# Patient Record
Sex: Female | Born: 1985 | Race: White | Hispanic: No | Smoking: Former smoker
Health system: Southern US, Community
[De-identification: ages and names within clinical notes are randomized; demographics above are authoritative.]

## PROBLEM LIST (undated history)

## (undated) DIAGNOSIS — L508 Other urticaria: Secondary | ICD-10-CM

---
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
## 2017-06-25 ENCOUNTER — Other Ambulatory Visit: Payer: Self-pay | Admitting: Obstetrics and Gynecology

## 2017-06-25 DIAGNOSIS — R928 Other abnormal and inconclusive findings on diagnostic imaging of breast: Secondary | ICD-10-CM

## 2017-07-04 ENCOUNTER — Ambulatory Visit: Payer: Self-pay

## 2017-07-04 ENCOUNTER — Ambulatory Visit
Admission: RE | Admit: 2017-07-04 | Discharge: 2017-07-04 | Disposition: A | Discharge status: Home / Self Care | Payer: BLUE CROSS/BLUE SHIELD | Source: Ambulatory Visit | Attending: Obstetrics and Gynecology | Admitting: Obstetrics and Gynecology

## 2017-07-04 ENCOUNTER — Other Ambulatory Visit: Payer: Self-pay | Admitting: Obstetrics and Gynecology

## 2017-07-04 DIAGNOSIS — R928 Other abnormal and inconclusive findings on diagnostic imaging of breast: Secondary | ICD-10-CM

## 2017-07-04 DIAGNOSIS — N6489 Other specified disorders of breast: Secondary | ICD-10-CM

## 2018-01-12 ENCOUNTER — Ambulatory Visit
Admission: RE | Admit: 2018-01-12 | Discharge: 2018-01-12 | Disposition: A | Discharge status: Home / Self Care | Payer: BLUE CROSS/BLUE SHIELD | Source: Ambulatory Visit | Attending: Obstetrics and Gynecology | Admitting: Obstetrics and Gynecology

## 2018-01-12 ENCOUNTER — Other Ambulatory Visit: Payer: Self-pay | Admitting: Obstetrics and Gynecology

## 2018-01-12 DIAGNOSIS — N6489 Other specified disorders of breast: Secondary | ICD-10-CM

## 2018-01-12 DIAGNOSIS — N6320 Unspecified lump in the left breast, unspecified quadrant: Secondary | ICD-10-CM

## 2018-01-16 ENCOUNTER — Ambulatory Visit
Admission: RE | Admit: 2018-01-16 | Discharge: 2018-01-16 | Disposition: A | Discharge status: Home / Self Care | Payer: BLUE CROSS/BLUE SHIELD | Source: Ambulatory Visit | Attending: Obstetrics and Gynecology | Admitting: Obstetrics and Gynecology

## 2018-01-16 ENCOUNTER — Other Ambulatory Visit: Payer: Self-pay | Admitting: Obstetrics and Gynecology

## 2018-01-16 DIAGNOSIS — N6320 Unspecified lump in the left breast, unspecified quadrant: Secondary | ICD-10-CM

## 2018-10-23 ENCOUNTER — Other Ambulatory Visit: Payer: Self-pay | Admitting: Obstetrics and Gynecology

## 2018-10-23 DIAGNOSIS — N6322 Unspecified lump in the left breast, upper inner quadrant: Secondary | ICD-10-CM

## 2018-11-04 ENCOUNTER — Ambulatory Visit
Admission: RE | Admit: 2018-11-04 | Discharge: 2018-11-04 | Disposition: A | Discharge status: Home / Self Care | Payer: BC Managed Care – PPO | Source: Ambulatory Visit | Attending: Obstetrics and Gynecology | Admitting: Obstetrics and Gynecology

## 2018-11-04 ENCOUNTER — Other Ambulatory Visit: Payer: Self-pay

## 2018-11-04 DIAGNOSIS — N6322 Unspecified lump in the left breast, upper inner quadrant: Secondary | ICD-10-CM

## 2019-01-18 ENCOUNTER — Emergency Department (HOSPITAL_COMMUNITY)
Admission: EM | Admit: 2019-01-18 | Discharge: 2019-01-18 | Disposition: A | Discharge status: Home / Self Care | Payer: BC Managed Care – PPO | Attending: Emergency Medicine | Admitting: Emergency Medicine

## 2019-01-18 DIAGNOSIS — T7840XA Allergy, unspecified, initial encounter: Secondary | ICD-10-CM | POA: Insufficient documentation

## 2019-01-18 DIAGNOSIS — R220 Localized swelling, mass and lump, head: Secondary | ICD-10-CM | POA: Insufficient documentation

## 2019-01-18 DIAGNOSIS — L509 Urticaria, unspecified: Secondary | ICD-10-CM | POA: Diagnosis not present

## 2019-01-18 LAB — URINALYSIS, ROUTINE W REFLEX MICROSCOPIC
Bilirubin Urine: NEGATIVE
Glucose, UA: NEGATIVE mg/dL
Ketones, ur: NEGATIVE mg/dL
Nitrite: NEGATIVE
Protein, ur: 30 mg/dL — AB
Specific Gravity, Urine: 1.031 — ABNORMAL HIGH (ref 1.005–1.030)
pH: 5 (ref 5.0–8.0)

## 2019-01-18 MED ORDER — PREDNISONE 20 MG PO TABS
50.0000 mg | ORAL_TABLET | Freq: Once | ORAL | Status: AC
  Administered 2019-01-18: 50 mg via ORAL
  Filled 2019-01-18: qty 3

## 2019-01-18 MED ORDER — FAMOTIDINE 20 MG PO TABS: 20.0000 mg | ORAL_TABLET | Freq: Two times a day (BID) | ORAL | 0 refills | Status: AC

## 2019-01-18 MED ORDER — PREDNISONE 50 MG PO TABS: 50.0000 mg | ORAL_TABLET | Freq: Every day | ORAL | 0 refills | Status: DC

## 2019-01-18 MED ORDER — DIPHENHYDRAMINE HCL 25 MG PO CAPS
25.0000 mg | ORAL_CAPSULE | Freq: Once | ORAL | Status: AC
  Administered 2019-01-18: 25 mg via ORAL
  Filled 2019-01-18: qty 1

## 2019-01-18 MED ORDER — FAMOTIDINE 20 MG PO TABS
20.0000 mg | ORAL_TABLET | Freq: Once | ORAL | Status: AC
  Administered 2019-01-18: 20 mg via ORAL
  Filled 2019-01-18: qty 1

## 2019-01-18 NOTE — ED Notes (Signed)
Patient verbalizes understanding of discharge instructions. Opportunity for questioning and answers were provided. Armband removed by staff, pt discharged from ED ambulatory to Home.   

## 2019-01-18 NOTE — Discharge Instructions (Signed)
Please read attached information. If you experience any new or worsening signs or symptoms please return to the emergency room for evaluation. Please follow-up with your primary care provider or specialist as discussed. Please use medication prescribed only as directed and discontinue taking if you have any concerning signs or symptoms.   °

## 2019-01-18 NOTE — ED Provider Notes (Signed)
[facility] EMERGENCY DEPARTMENT Provider Note   CSN: [id number] Arrival date & time: 01/18/19  0725     History   Chief Complaint Chief Complaint  Patient presents with  . Allergic Reaction    HPI Tammy Steele is a 33 y.o. female.     HPI   33 year old female presents today with complaints of hives.  Patient notes that 3 days ago while at work she started to develop hives and swelling to her face.  She notes that she followed up in the emergency room the following day as symptoms were not improving with Benadryl.  She notes she was given epinephrine, Benadryl and steroids.  She notes her symptoms did improve and she went home.  She notes they came back approximate 4 hours later.  She was again seen the following day and was having ongoing hives.  She was also having increased anxiety and stress.  She has not been taking the prednisone as it was thought to increase her stress.  She denies any abnormal exposures including new foods drinks body care products.  She does note she has increased stress recently.  She notes she has had hives previously but not this severe.  She denies any recent medications, other than Macrobid approximately 2 weeks prior.  She does note some pain in burning with urination no vaginal discharge, she does note vaginal bleeding with her menstrual cycle presently.  She denies any abdominal pain nausea vomiting or diarrhea.  She denies any swelling to the oropharynx or any shortness of breath.  She took Benadryl at approximately 4:30 AM today.  No past medical history on file.  There are no active problems to display for this patient.   No past surgical history on file.   OB History   No obstetric history on file.      Home Medications    Prior to Admission medications   Medication Sig Start Date End Date Taking? Authorizing Provider  famotidine (PEPCID) 20 MG tablet Take 1 tablet (20 mg total) by mouth 2 (two) times daily.  01/18/19   Benjamin Merrihew, Dellis Filbert, PA-C  predniSONE (DELTASONE) 50 MG tablet Take 1 tablet (50 mg total) by mouth daily. 01/18/19   Okey Regal, PA-C    Family History Family History  Problem Relation Age of Onset  . Breast cancer Mother 35    Social History Social History   Tobacco Use  . Smoking status: Not on file  Substance Use Topics  . Alcohol use: Not on file  . Drug use: Not on file     Allergies   Patient has no allergy information on record.   Review of Systems Review of Systems  All other systems reviewed and are negative.   Physical Exam Updated Vital Signs BP 105/70 (BP Location: Right Arm)   Pulse 89   Temp 98.5 F (36.9 C) (Oral)   Resp 12   SpO2 100%   Physical Exam Vitals signs and nursing note reviewed.  Constitutional:      Appearance: She is well-developed.  HENT:     Head: Normocephalic and atraumatic.     Comments: Oropharynx is clear no erythema edema or exudate, voice is normal, handling secretions without difficulty Eyes:     General: No scleral icterus.       Right eye: No discharge.        Left eye: No discharge.     Conjunctiva/sclera: Conjunctivae normal.     Pupils: Pupils are equal, round,  and reactive to light.  Neck:     Musculoskeletal: Normal range of motion.     Vascular: No JVD.     Trachea: No tracheal deviation.  Pulmonary:     Effort: Pulmonary effort is normal. No respiratory distress.     Breath sounds: Normal breath sounds. No stridor. No wheezing, rhonchi or rales.  Skin:    Comments: Hives noted throughout the entire body  Neurological:     Mental Status: She is alert and oriented to person, place, and time.     Coordination: Coordination normal.  Psychiatric:        Behavior: Behavior normal.        Thought Content: Thought content normal.        Judgment: Judgment normal.     ED Treatments / Results  Labs (all labs ordered are listed, but only abnormal results are displayed) Labs Reviewed   URINALYSIS, ROUTINE W REFLEX MICROSCOPIC - Abnormal; Notable for the following components:      Result Value   Color, Urine AMBER (*)    APPearance HAZY (*)    Specific Gravity, Urine 1.031 (*)    Hgb urine dipstick MODERATE (*)    Protein, ur 30 (*)    Leukocytes,Ua SMALL (*)    Bacteria, UA RARE (*)    All other components within normal limits  URINE CULTURE  POC URINE PREG, ED    EKG None  Radiology No results found.  Procedures Procedures (including critical care time)  Medications Ordered in ED Medications  predniSONE (DELTASONE) tablet 50 mg (50 mg Oral Given 01/18/19 0953)  famotidine (PEPCID) tablet 20 mg (20 mg Oral Given 01/18/19 0953)  diphenhydrAMINE (BENADRYL) capsule 25 mg (25 mg Oral Given 01/18/19 0953)     Initial Impression / Assessment and Plan / ED Course  I have reviewed the triage vital signs and the nursing notes.  Pertinent labs & imaging results that were available during my care of the patient were reviewed by me and considered in my medical decision making (see chart for details).        33 year old female presents today with hives.  Uncertain etiology at this time.  She has no signs of anaphylaxis, no other system involvement.  Patient is having extreme itching, I do think it is reasonable for her to continue using her prednisone, Benadryl, and Pepcid as needed.  She has no new medications or exposures at this time to be discontinued or avoided.  Patient will be given a dose of the medication here, no indication for hospitalization or ongoing management at this time.  I would encourage her to follow-up with outpatient allergy testing, she will return immediately if she develops any new or worsening signs or symptoms and follow-up with her primary care provider in the next 2 to 3 days.  She verbalized understanding and agreement to today's plan had no further questions or concerns at the time of discharge.   Final Clinical Impressions(s) / ED  Diagnoses   Final diagnoses:  Hives    ED Discharge Orders         Ordered    predniSONE (DELTASONE) 50 MG tablet  Daily     01/18/19 1109    famotidine (PEPCID) 20 MG tablet  2 times daily     01/18/19 1109           Eyvonne Mechanic, PA-C 01/18/19 1159    Margarita Grizzle, MD 01/18/19 1819

## 2019-01-18 NOTE — ED Triage Notes (Signed)
Pt reports allergic reaction beginning Friday. Went to the ER in [place] twice over the weekend and received epi x2. Pt here no acute distress. Discharged with scripts for prednisone and benedryl. Was told to stop taking prednisone. Still taking benedryl. Lungs CTA. Has hive rash allover.

## 2019-01-19 LAB — URINE CULTURE
Culture: <10000 — AB
Special Requests: NORMAL

## 2019-01-21 NOTE — Progress Notes (Signed)
New Patient Note  RE: Tammy Steele MRN: [id number] DOB: 1985/11/10 Date of Office Visit: 01/22/2019  Referring provider: No ref. provider found Primary care provider: System, Pcp Not In  Chief Complaint: Urticaria  History of Present Illness: I had the pleasure of seeing Tammy Steele for initial evaluation at the [facility] on 01/22/2019. She is a 33 y.o. female, who is referred here by the ER for the evaluation of hives.   Hives: Rash started about 1 week ago. She started to break out in her right ear and developed lumps on her scalp. Then it spread to her hands, feet, chest and face.  Patient went to the ER on 10/10. She was given epi, steroids, benadryl there and symptoms improved within a few hours but then she developed new hives after 4-5 hours at home.   She went to the ER on 10/11 and was given the same above regimen due to worsening hives which helped.  She also went to the ER on 01/20/2019 as she had some shortness of breath.  Describes the rash as red, raised, pruritic. Reviewed images on the phone which were consistent with urticaria. Individual rashes lasts about less than 1 day. No ecchymosis upon resolution. Associated symptoms include: lump in the throat with difficulty swallowing, SOB. Suspected triggers are unknown but heat makes it worse. Denies any fevers, chills, changes in medications, foods, personal care products. Patient had UTI a few weeks ago and was treated with nitrofurantoin for 5 days. Last dose was 4 days prior to hive onset.  Patient had this antibiotic before with no issues.   Patient is also under a lot of stress. Her husband left patient 1 year ago and had increased stress lately due to this.   She has tried the following therapies: Pepcid  BID, Claritin  QD, prednisone 10 mg daily with some benefit. Systemic steroids yes. Previous work up includes: none. Previous history of rash/hives: none.  Hives have  significantly improved and yesterday she had a few blotches.  No hives today.  01/18/2019 ER HPI: "33 year old female presents today with complaints of hives.  Patient notes that 3 days ago while at work she started to develop hives and swelling to her face.  She notes that she followed up in the emergency room the following day as symptoms were not improving with Benadryl.  She notes she was given epinephrine, Benadryl and steroids.  She notes her symptoms did improve and she went home.  She notes they came back approximate 4 hours later.  She was again seen the following day and was having ongoing hives.  She was also having increased anxiety and stress.  She has not been taking the prednisone as it was thought to increase her stress.  She denies any abnormal exposures including new foods drinks body care products.  She does note she has increased stress recently.  She notes she has had hives previously but not this severe.  She denies any recent medications, other than Macrobid approximately 2 weeks prior.  She does note some pain in burning with urination no vaginal discharge, she does note vaginal bleeding with her menstrual cycle presently.  She denies any abdominal pain nausea vomiting or diarrhea.  She denies any swelling to the oropharynx or any shortness of breath.  She took Benadryl at approximately 4:30 AM today."  Assessment and Plan: Tammy Steele is a 33 y.o. female with: Acute urticaria Whole body hives with angioedema starting 1 week  ago.  Required 3 ER visits and 2 IM epi with Benadryl and steroids with good benefit.  Symptoms have gradually improved.  Patient did have a UTI few weeks ago and was treated with nitrofurantoin for 5 days.  Last dose was 4 days prior to hive onset.  She has taken this antibiotic previously with no issues.  She also had increased test the last year.  Based on clinical history concern that the infection with the nitrofurantoin may have triggered these episodes of  hives and angioedema.  Advised patient to avoid nitrofurantoin.  Continue taking Pepcid 20mg  twice a day.  Continue Claritin 10mg  in the morning.  Start Zyrtec 10mg  in the evening.   Start prednisone taper tomorrow.   If you notice worsening symptoms while tapering off then let us.   You may take benadryl 25-50mg  every 4-6 hours as needed for breakthrough hives.  Avoid the following potential triggers: alcohol, tight clothing, NSAIDs.   Emergency action plan given.  For mild symptoms you can take over the counter antihistamines such as Benadryl and monitor symptoms closely. If symptoms worsen or if you have severe symptoms including breathing issues, throat closure, significant swelling, whole body hives, severe diarrhea and vomiting, lightheadedness then inject epinephrine and seek immediate medical care afterwards.  If symptoms persistent in 4 weeks then will get some blood work/work-up at that time.  If hives resolved by then will discuss tapering off Zyrtec, Claritin and Pepcid.  Multiple drug allergies History of breaking out in rash with penicillin and Bactrim.  Unsure of exact events.  Continue avoidance and consider penicillin skin testing in future.  Return in about 4 weeks (around 02/19/2019).  Other allergy screening: Asthma: no Rhino conjunctivitis: no Food allergy: no Medication allergy: yes  Penicillin - rash, itchy.  Unsure of exact events and this happened many years ago. Bactrim - rash, itchy Hymenoptera allergy: no Eczema:no History of recurrent infections suggestive of immunodeficency: no  Diagnostics: None.  Past Medical History: Patient Active Problem List   Diagnosis Date Noted  . Acute urticaria 01/22/2019  . Multiple drug allergies 01/22/2019   Past Medical History:  Diagnosis Date  . Acute urticaria 01/22/2019   Past Surgical History: Past Surgical History:  Procedure Laterality Date  . CESAREAN SECTION     Medication List:  Current  Outpatient Medications  Medication Sig Dispense Refill  . ALPRAZolam (XANAX) 0.5 MG tablet Take 0.5 mg by mouth at bedtime as needed for anxiety.    . cetirizine (ZYRTEC) 10 MG tablet Take 10 mg by mouth at bedtime.    . citalopram (CELEXA) 40 MG tablet Take 40 mg by mouth daily.    . diphenhydrAMINE (BENADRYL) 25 MG tablet Take 25 mg by mouth every 6 (six) hours as needed.    Marland Kitchen. EPINEPHrine 0.3 mg/0.3 mL IJ SOAJ injection Inject 0.3 mg into the muscle once.    . famotidine (PEPCID) 20 MG tablet Take 1 tablet (20 mg total) by mouth 2 (two) times daily. 30 tablet 0  . loratadine (CLARITIN) 10 MG tablet Take 10 mg by mouth daily.    Marland Kitchen. MONO-LINYAH 0.25-35 MG-MCG tablet Take 1 tablet by mouth daily.     No current facility-administered medications for this visit.    Allergies: Allergies  Allergen Reactions  . Bactrim [Sulfamethoxazole-Trimethoprim]   . Nitrofurantoin     hives  . Penicillins    Social History: Social History   Socioeconomic History  . Marital status: Legally Separated    Spouse name:  Not on file  . Number of children: Not on file  . Years of education: Not on file  . Highest education level: Not on file  Occupational History  . Not on file  Social Needs  . Financial resource strain: Not on file  . Food insecurity    Worry: Not on file    Inability: Not on file  . Transportation needs    Medical: Not on file    Non-medical: Not on file  Tobacco Use  . Smoking status: Former Smoker    Types: Cigarettes  . Smokeless tobacco: Never Used  Substance and Sexual Activity  . Alcohol use: Yes    Comment: occ  . Drug use: Never  . Sexual activity: Not on file  Lifestyle  . Physical activity    Days per week: Not on file    Minutes per session: Not on file  . Stress: Not on file  Relationships  . Social Herbalist on phone: Not on file    Gets together: Not on file    Attends religious service: Not on file    Active member of club or organization:  Not on file    Attends meetings of clubs or organizations: Not on file    Relationship status: Not on file  Other Topics Concern  . Not on file  Social History Narrative  . Not on file   Lives in a 35 year old house. Smoking: quit 7 days ago, 1/4 to 1/2 pack per day.  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 Breast cancer Mother 59  . Allergic rhinitis Neg Hx   . Angioedema Neg Hx   . Asthma Neg Hx   . Eczema Neg Hx   . Immunodeficiency Neg Hx   . Urticaria Neg Hx    Review of Systems  Constitutional: Negative for appetite change, chills, fever and unexpected weight change.  HENT: Negative for congestion and rhinorrhea.   Eyes: Negative for itching.  Respiratory: Negative for cough, chest tightness, shortness of breath and wheezing.   Cardiovascular: Negative for chest pain.  Gastrointestinal: Positive for constipation. Negative for abdominal pain.  Genitourinary: Negative for difficulty urinating.  Skin: Positive for rash.  Allergic/Immunologic: Negative for environmental allergies and food allergies.  Neurological: Negative for headaches.   Objective: BP 118/76   Pulse 85   Temp (!) 97.5 F (36.4 C) (Temporal)   Resp 16   Ht 5' 1.8" (1.57 m)   Wt 219 lb 3.2 oz (99.4 kg)   SpO2 97%   BMI 40.35 kg/m  Body mass index is 40.35 kg/m. Physical Exam  Constitutional: She is oriented to person, place, and time. She appears well-developed and well-nourished.  HENT:  Head: Normocephalic and atraumatic.  Right Ear: External ear normal.  Left Ear: External ear normal.  Nose: Nose normal.  Mouth/Throat: Oropharynx is clear and moist.  Eyes: Conjunctivae and EOM are normal.  Neck: Neck supple.  Cardiovascular: Normal  rate, regular rhythm and normal heart sounds. Exam reveals no gallop and no friction rub.  No murmur heard. Pulmonary/Chest: Effort normal and breath sounds normal. She has no wheezes. She has no rales.  Abdominal: Soft.  Neurological: She is alert and oriented to  person, place, and time.  Skin: Skin is warm. No rash noted.  Psychiatric: She has a normal mood and affect. Her behavior is normal.  Nursing note and vitals reviewed.  The plan was reviewed with the patient/family, and all questions/concerned were addressed.  It was my pleasure to see Tammy Steele today and participate in her care. Please feel free to contact me with any questions or concerns.  Sincerely,  Wyline Mood, DO Allergy & Immunology  [facility] office: [phone] [facility] office: [phone] [place] office: [phone]

## 2019-01-22 ENCOUNTER — Other Ambulatory Visit: Payer: Self-pay

## 2019-01-22 ENCOUNTER — Encounter: Payer: Self-pay | Admitting: Allergy

## 2019-01-22 ENCOUNTER — Ambulatory Visit: Payer: BC Managed Care – PPO | Admitting: Allergy

## 2019-01-22 VITALS — BP 118/76 | HR 85 | Temp 97.5°F | Resp 16 | Ht 61.8 in | Wt 219.2 lb

## 2019-01-22 DIAGNOSIS — Z889 Allergy status to unspecified drugs, medicaments and biological substances status: Secondary | ICD-10-CM | POA: Diagnosis not present

## 2019-01-22 DIAGNOSIS — L508 Other urticaria: Secondary | ICD-10-CM | POA: Diagnosis not present

## 2019-01-22 HISTORY — DX: Other urticaria: L50.8

## 2019-01-22 MED ORDER — CETIRIZINE HCL 10 MG PO TABS: 10.0000 mg | ORAL_TABLET | Freq: Every day | ORAL | 5 refills | Status: AC

## 2019-01-22 NOTE — Assessment & Plan Note (Signed)
History of breaking out in rash with penicillin and Bactrim.  Unsure of exact events.  Continue avoidance and consider penicillin skin testing in future.

## 2019-01-22 NOTE — Assessment & Plan Note (Signed)
Whole body hives with angioedema starting 1 week ago.  Required 3 ER visits and 2 IM epi with Benadryl and steroids with good benefit.  Symptoms have gradually improved.  Patient did have a UTI few weeks ago and was treated with nitrofurantoin for 5 days.  Last dose was 4 days prior to hive onset.  She has taken this antibiotic previously with no issues.  She also had increased test the last year.  Based on clinical history concern that the infection with the nitrofurantoin may have triggered these episodes of hives and angioedema.  Advised patient to avoid nitrofurantoin.  Continue taking Pepcid 20mg  twice a day.  Continue Claritin 10mg  in the morning.  Start Zyrtec 10mg  in the evening.   Start prednisone taper tomorrow.   If you notice worsening symptoms while tapering off then let us.   You may take benadryl 25-50mg  every 4-6 hours as needed for breakthrough hives.  Avoid the following potential triggers: alcohol, tight clothing, NSAIDs.   Emergency action plan given.  For mild symptoms you can take over the counter antihistamines such as Benadryl and monitor symptoms closely. If symptoms worsen or if you have severe symptoms including breathing issues, throat closure, significant swelling, whole body hives, severe diarrhea and vomiting, lightheadedness then inject epinephrine and seek immediate medical care afterwards.  If symptoms persistent in 4 weeks then will get some blood work/work-up at that time.  If hives resolved by then will discuss tapering off Zyrtec, Claritin and Pepcid.

## 2019-01-22 NOTE — Patient Instructions (Addendum)
   Continue taking Pepcid 20mg  twice a day.  Continue Claritin 10mg  in the morning.  Start Zyrtec 10mg  in the evening.    Start prednisone taper tomorrow.   If you notice worsening symptoms while tapering off then let us.   You may take benadryl 25-50mg  every 4-6 hours as needed for breakthrough hives.   Do not take nitrofurantoin in the future. Avoid the following potential triggers: alcohol, tight clothing, NSAIDs.    Consider penicillin skin testing in future.     Emergency action plan given.  Follow up in 4 weeks or sooner if needed.  If still having issues then, we will get some bloodwork and additional work up at that time. If doing better then will discuss how to taper off the medications.   Keep up the good job with quitting smoking!

## 2019-02-22 ENCOUNTER — Ambulatory Visit: Payer: BC Managed Care – PPO | Admitting: Allergy and Immunology

## 2019-09-28 ENCOUNTER — Other Ambulatory Visit: Payer: Self-pay | Admitting: Obstetrics and Gynecology

## 2019-09-28 DIAGNOSIS — Z1231 Encounter for screening mammogram for malignant neoplasm of breast: Secondary | ICD-10-CM

## 2019-12-21 ENCOUNTER — Ambulatory Visit
Admission: RE | Admit: 2019-12-21 | Discharge: 2019-12-21 | Disposition: A | Discharge status: Home / Self Care | Payer: BC Managed Care – PPO | Source: Ambulatory Visit | Attending: Obstetrics and Gynecology | Admitting: Obstetrics and Gynecology

## 2019-12-21 ENCOUNTER — Other Ambulatory Visit: Payer: Self-pay

## 2019-12-21 DIAGNOSIS — Z1231 Encounter for screening mammogram for malignant neoplasm of breast: Secondary | ICD-10-CM

## 2019-12-23 ENCOUNTER — Other Ambulatory Visit: Payer: Self-pay | Admitting: Obstetrics and Gynecology

## 2019-12-23 DIAGNOSIS — R928 Other abnormal and inconclusive findings on diagnostic imaging of breast: Secondary | ICD-10-CM

## 2020-01-07 ENCOUNTER — Ambulatory Visit
Admission: RE | Admit: 2020-01-07 | Discharge: 2020-01-07 | Disposition: A | Discharge status: Home / Self Care | Payer: BC Managed Care – PPO | Source: Ambulatory Visit | Attending: Obstetrics and Gynecology | Admitting: Obstetrics and Gynecology

## 2020-01-07 ENCOUNTER — Other Ambulatory Visit: Payer: Self-pay

## 2020-01-07 DIAGNOSIS — R928 Other abnormal and inconclusive findings on diagnostic imaging of breast: Secondary | ICD-10-CM

## 2020-11-28 ENCOUNTER — Other Ambulatory Visit: Payer: Self-pay | Admitting: Obstetrics and Gynecology

## 2020-11-28 DIAGNOSIS — Z1231 Encounter for screening mammogram for malignant neoplasm of breast: Secondary | ICD-10-CM

## 2021-01-05 ENCOUNTER — Ambulatory Visit: Payer: BC Managed Care – PPO

## 2021-01-05 ENCOUNTER — Other Ambulatory Visit: Payer: Self-pay

## 2021-01-05 ENCOUNTER — Ambulatory Visit
Admission: RE | Admit: 2021-01-05 | Discharge: 2021-01-05 | Disposition: A | Discharge status: Home / Self Care | Payer: BC Managed Care – PPO | Source: Ambulatory Visit | Attending: Obstetrics and Gynecology | Admitting: Obstetrics and Gynecology

## 2021-01-05 DIAGNOSIS — Z1231 Encounter for screening mammogram for malignant neoplasm of breast: Secondary | ICD-10-CM

## 2021-05-30 IMAGING — MG DIGITAL DIAGNOSTIC BILAT W/ TOMO W/ CAD
8 series · 8 of 24 positions shown · non-contrast
Comparison: Previous exam(s).

CLINICAL DATA: Screening recall for possible asymmetries in each
breast, 1 on the right and 2 on the left.

EXAM:
DIGITAL DIAGNOSTIC BILATERAL MAMMOGRAM WITH CAD AND TOMO
ULTRASOUND BILATERAL BREAST

[L CC synth-2D]
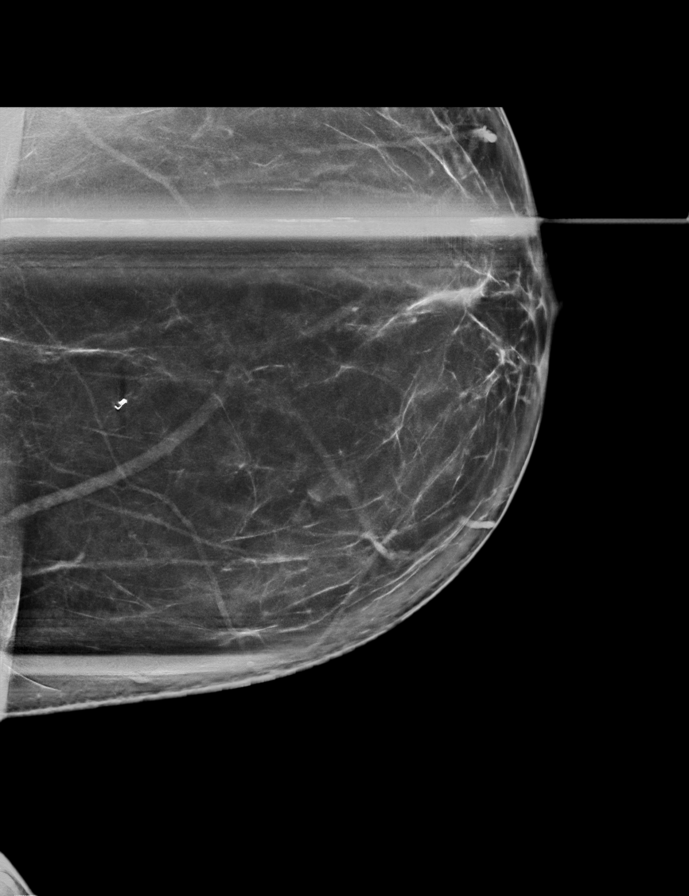

[L MLO synth-2D]
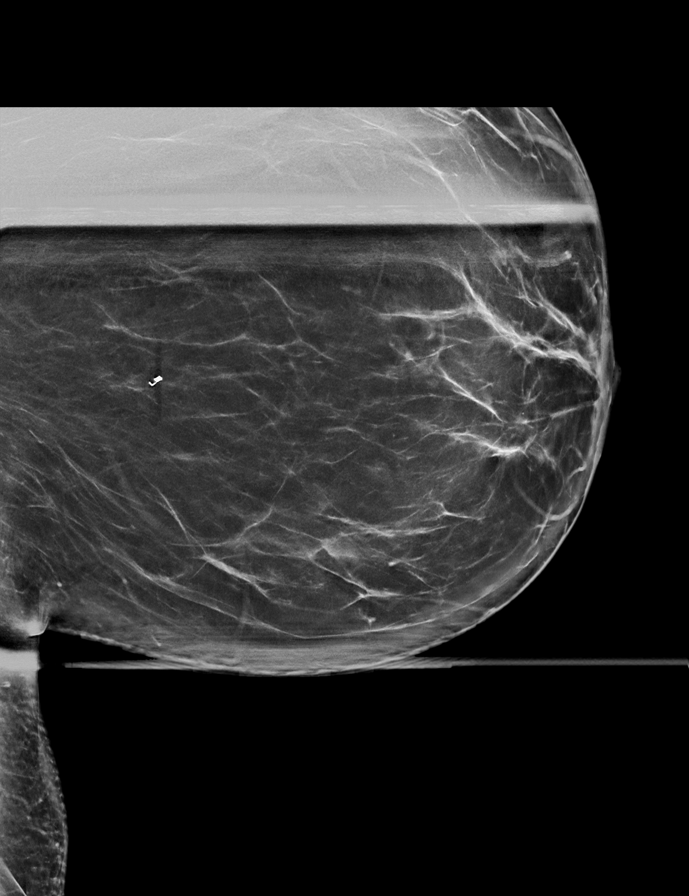

[R MLO synth-2D]
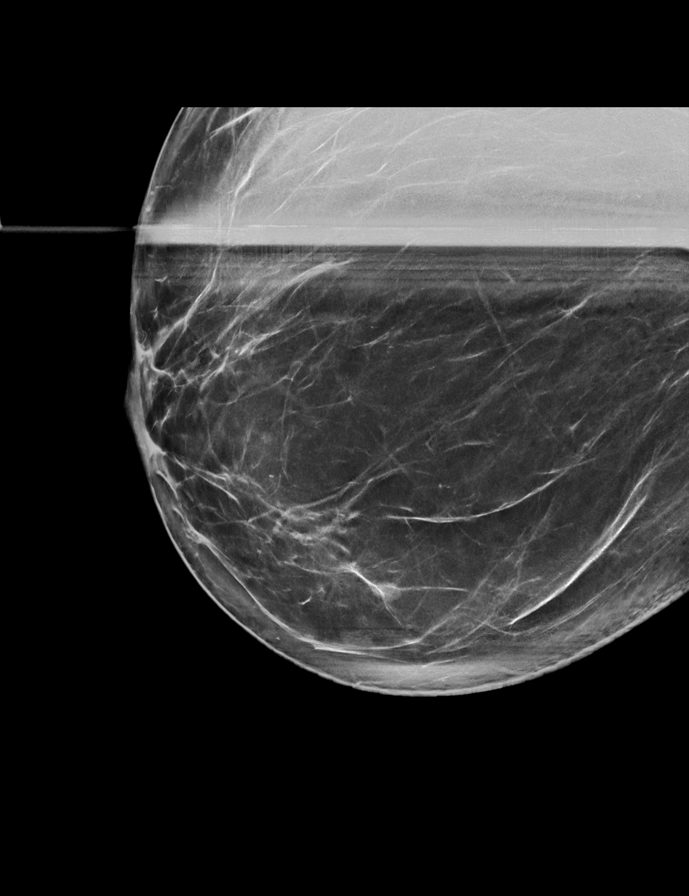

[R CC synth-2D]
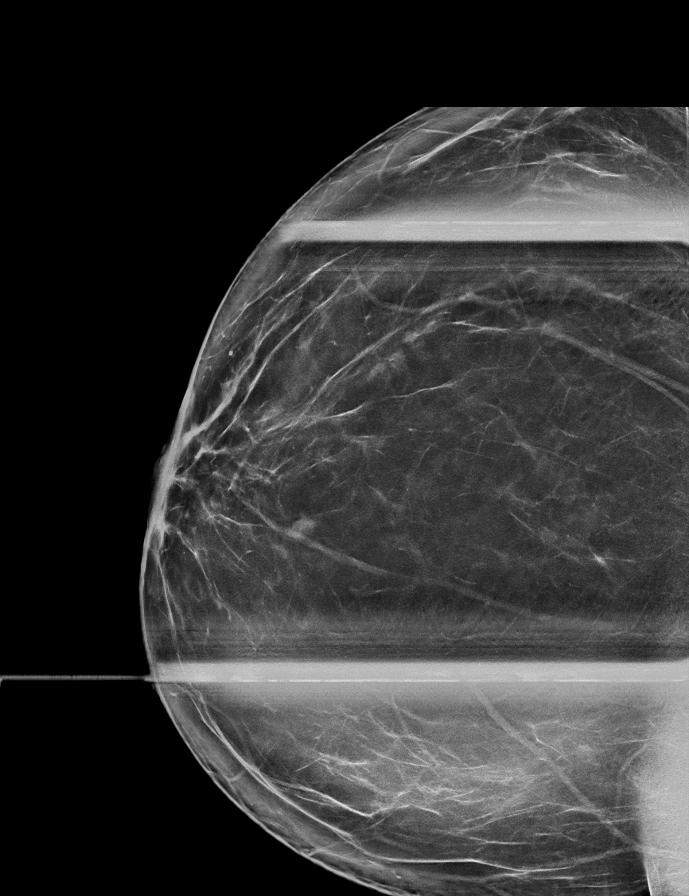

[L CC tomo · tomo slice 43/86.0]
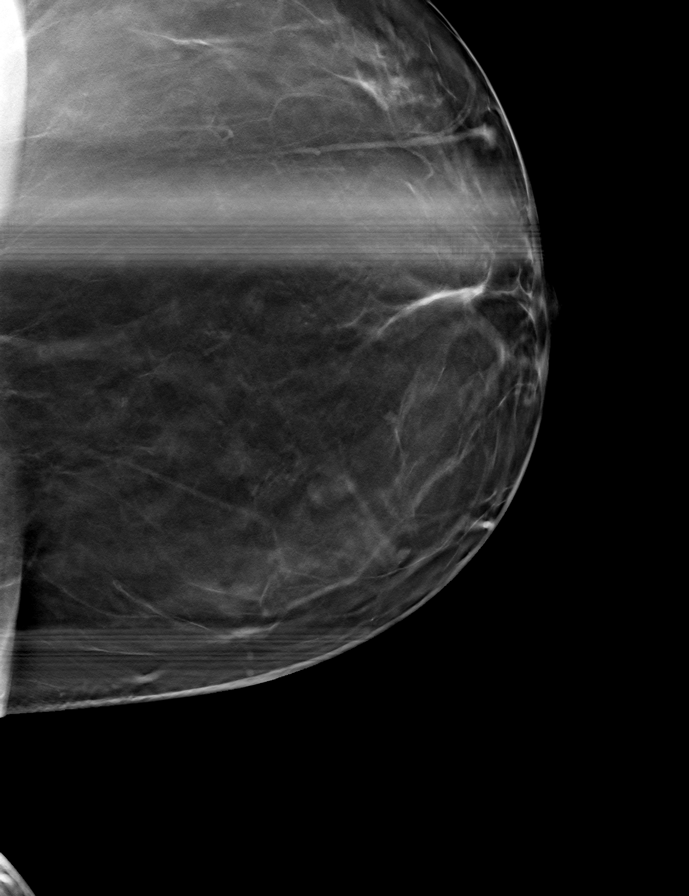

[R CC tomo · tomo slice 45/90.0]
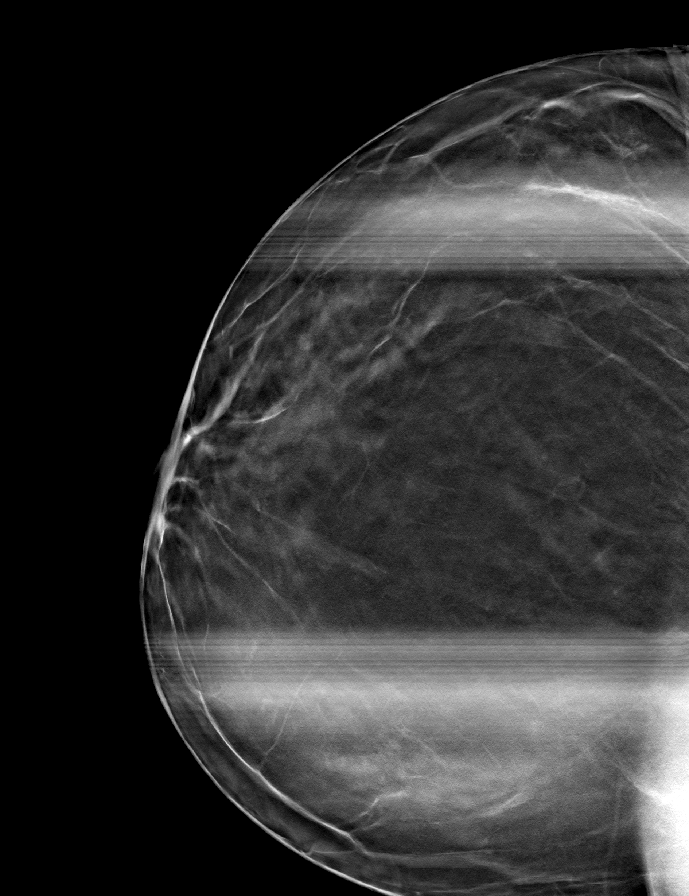

[L MLO tomo · tomo slice 47/94.0]
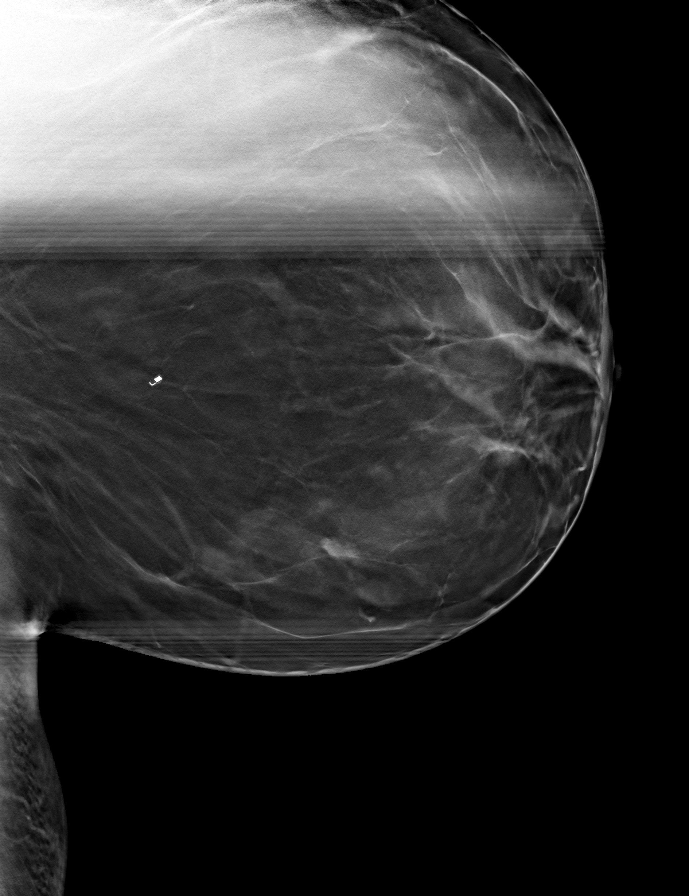

[R MLO tomo · tomo slice 45/88.0]
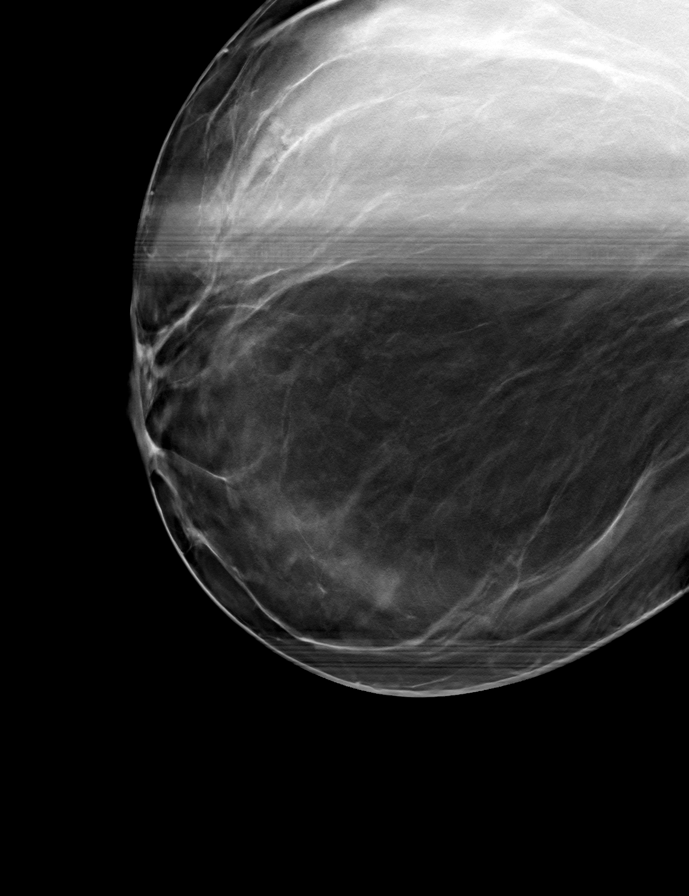

[8 of 24 positions shown; findings below may reference images not displayed]

ACR Breast Density Category b: There are scattered areas of
fibroglandular density.
FINDINGS: On the right, the possible asymmetry persists as a small oval mostly
circumscribed mass in the inferior breast, slightly medial to
midline, measuring 5-6 mm in long axis.

On the left 1 of the 2 asymmetries persists as a small elongated
mass with partly defined margins and some mixed attenuation, in the
lower inner quadrant. The second smaller mass is not visualized on
the spot compression images.

Mammographic images were processed with CAD.

Targeted right breast ultrasound is performed, showing a cluster of
cysts at 6 o'clock, 3 cm the nipple, measuring 9 x 3 x 5 mm,
consistent in size, shape and location to the mammographic mass. No
other abnormalities.

Targeted left breast ultrasound is performed, showing 2 adjacent
cluster of cysts. In the 7 o'clock position, 3 cm from the nipple,
there is an elongated cluster of cysts measuring 8 x 2 x 5 mm. At 8
o'clock, 3 cm the nipple, there is a smaller cluster of cysts
measuring 4 x 3 x 3 mm. These account for the mammographic asymmetry
seen on the current screening study with the longer of the 2
accounting for the persistent mass seen on the diagnostic mammogram.
IMPRESSION: 1. No evidence of breast malignancy.
2. Benign bilateral clusters of cysts/apocrine cysts.

RECOMMENDATION:
Screening mammogram in one year.(Code:N4-S-2Z2)

I have discussed the findings and recommendations with the patient.
If applicable, a reminder letter will be sent to the patient
regarding the next appointment.

BI-RADS CATEGORY  2: Benign.
